# Patient Record
Sex: Male | Born: 1985 | Race: White | Hispanic: No | Marital: Single | Smoking: Current every day smoker
Health system: Southern US, Community
[De-identification: ages and names within clinical notes are randomized; demographics above are authoritative.]

---

## 2013-09-16 ENCOUNTER — Emergency Department (HOSPITAL_COMMUNITY)
Admission: EM | Admit: 2013-09-16 | Discharge: 2013-09-16 | Disposition: A | Discharge status: Home / Self Care | Attending: Emergency Medicine | Admitting: Emergency Medicine

## 2013-09-16 ENCOUNTER — Emergency Department (HOSPITAL_COMMUNITY)

## 2013-09-16 ENCOUNTER — Encounter (HOSPITAL_COMMUNITY): Payer: Self-pay | Admitting: Emergency Medicine

## 2013-09-16 DIAGNOSIS — N200 Calculus of kidney: Secondary | ICD-10-CM

## 2013-09-16 DIAGNOSIS — F17200 Nicotine dependence, unspecified, uncomplicated: Secondary | ICD-10-CM | POA: Insufficient documentation

## 2013-09-16 LAB — BASIC METABOLIC PANEL
BUN: 24 mg/dL — ABNORMAL HIGH (ref 6–23)
CHLORIDE: 102 meq/L (ref 96–112)
CO2: 25 meq/L (ref 19–32)
CREATININE: 1.25 mg/dL (ref 0.50–1.35)
Calcium: 9.9 mg/dL (ref 8.4–10.5)
GFR calc non Af Amer: 78 mL/min — ABNORMAL LOW (ref >90)
GLUCOSE: 98 mg/dL (ref 70–99)
POTASSIUM: 4 meq/L (ref 3.7–5.3)
Sodium: 141 mEq/L (ref 137–147)

## 2013-09-16 LAB — CBC WITH DIFFERENTIAL/PLATELET
BASOS PCT: 0 % (ref 0–1)
Basophils Absolute: 0 10*3/uL (ref 0.0–0.1)
EOS PCT: 0 % (ref 0–5)
Eosinophils Absolute: 0 10*3/uL (ref 0.0–0.7)
HEMATOCRIT: 46.6 % (ref 39.0–52.0)
HEMOGLOBIN: 15.7 g/dL (ref 13.0–17.0)
LYMPHS PCT: 13 % (ref 12–46)
Lymphs Abs: 1.8 10*3/uL (ref 0.7–4.0)
MCH: 28.9 pg (ref 26.0–34.0)
MCHC: 33.7 g/dL (ref 30.0–36.0)
MCV: 85.8 fL (ref 78.0–100.0)
MONOS PCT: 9 % (ref 3–12)
Monocytes Absolute: 1.2 10*3/uL — ABNORMAL HIGH (ref 0.1–1.0)
Neutro Abs: 10.7 10*3/uL — ABNORMAL HIGH (ref 1.7–7.7)
Neutrophils Relative %: 78 % — ABNORMAL HIGH (ref 43–77)
Platelets: 189 10*3/uL (ref 150–400)
RBC: 5.43 MIL/uL (ref 4.22–5.81)
RDW: 12.6 % (ref 11.5–15.5)
WBC: 13.7 10*3/uL — AB (ref 4.0–10.5)

## 2013-09-16 LAB — URINE MICROSCOPIC-ADD ON

## 2013-09-16 LAB — URINALYSIS, ROUTINE W REFLEX MICROSCOPIC
GLUCOSE, UA: NEGATIVE mg/dL
Ketones, ur: NEGATIVE mg/dL
Leukocytes, UA: NEGATIVE
Nitrite: NEGATIVE
PH: 6 (ref 5.0–8.0)
Specific Gravity, Urine: >1.03 — ABNORMAL HIGH (ref 1.005–1.030)
Urobilinogen, UA: 0.2 mg/dL (ref 0.0–1.0)

## 2013-09-16 MED ORDER — HYDROMORPHONE HCL PF 1 MG/ML IJ SOLN
1.0000 mg | Freq: Once | INTRAMUSCULAR | Status: AC
  Administered 2013-09-16: 1 mg via INTRAVENOUS
  Filled 2013-09-16: qty 1

## 2013-09-16 MED ORDER — KETOROLAC TROMETHAMINE 30 MG/ML IJ SOLN
30.0000 mg | Freq: Once | INTRAMUSCULAR | Status: AC
  Administered 2013-09-16: 30 mg via INTRAVENOUS
  Filled 2013-09-16: qty 1

## 2013-09-16 MED ORDER — IBUPROFEN 800 MG PO TABS: 800.0000 mg | ORAL_TABLET | Freq: Three times a day (TID) | ORAL | Status: AC

## 2013-09-16 MED ORDER — ONDANSETRON HCL 4 MG PO TABS: 4.0000 mg | ORAL_TABLET | Freq: Four times a day (QID) | ORAL | Status: AC

## 2013-09-16 MED ORDER — OXYCODONE-ACETAMINOPHEN 5-325 MG PO TABS: 2.0000 | ORAL_TABLET | ORAL | Status: AC | PRN

## 2013-09-16 MED ORDER — ONDANSETRON HCL 4 MG/2ML IJ SOLN
4.0000 mg | Freq: Once | INTRAMUSCULAR | Status: AC
  Administered 2013-09-16: 4 mg via INTRAVENOUS
  Filled 2013-09-16: qty 2

## 2013-09-16 MED ORDER — OXYCODONE-ACETAMINOPHEN 5-325 MG PO TABS
2.0000 | ORAL_TABLET | Freq: Once | ORAL | Status: AC
  Administered 2013-09-16: 2 via ORAL
  Filled 2013-09-16: qty 2

## 2013-09-16 MED ORDER — SODIUM CHLORIDE 0.9 % IV BOLUS (SEPSIS)
1000.0000 mL | Freq: Once | INTRAVENOUS | Status: AC
  Administered 2013-09-16: 1000 mL via INTRAVENOUS

## 2013-09-16 NOTE — ED Provider Notes (Signed)
CSN: [id number]     Arrival date & time 09/16/13  5732 History  This chart was scribed for Glynn Octave, MD by Shari Heritage, ED Scribe. The patient was seen in room APA04/APA04. Patient's care was started at 8:47 AM.  Chief Complaint  Patient presents with  . Flank Pain    The history is provided by the patient. No language interpreter was used.    HPI Comments: Jared Olson is a 28 y.o. male who presents to the Emergency Department complaining of severe, constant left flank pain that radiates towards his groin onset yesterday. He denies any relieving factors. Patient reports associated decreased urine, urinary urgency, nausea and left testicle pain. Patient denies a history of similar pain. Patient's last bowel movement was yesterday. He denies vomiting, diarrhea, hematuria, dysuria, chest pain, shortness of breath, penile discharge, testicular sweling or other symptoms at this time. Patient denies history of nephrolithiasis. He has no history of appendectomy or cholecystectomy. He has no known allergies. Patient has no chronic medical conditions. He is smokes cigarettes.  History reviewed. No pertinent past medical history. History reviewed. No pertinent past surgical history. History reviewed. No pertinent family history. History  Substance Use Topics  . Smoking status: Current Every Day Smoker  . Smokeless tobacco: Not on file  . Alcohol Use: Yes    Review of Systems A complete 10 system review of systems was obtained and all systems are negative except as noted in the HPI and PMH.    Allergies  Review of patient's allergies indicates no known allergies.  Home Medications   Prior to Admission medications   Not on File   Triage Vitals: BP 133/81  Pulse 58  Temp(Src) 97.9 F (36.6 C) (Oral)  Resp 17  Ht 6\' 2"  (1.88 m)  Wt 220 lb (99.791 kg)  BMI 28.23 kg/m2  SpO2 100% Physical Exam  Nursing note and vitals reviewed. Constitutional: He is oriented to person, place,  and time. He appears well-developed and well-nourished. No distress.  Uncomfortable.  HENT:  Head: Normocephalic and atraumatic.  Eyes: EOM are normal.  Neck: Neck supple. No tracheal deviation present.  Cardiovascular: Normal rate, regular rhythm and normal heart sounds.  Exam reveals no gallop and no friction rub.   No murmur heard. Pulmonary/Chest: Effort normal and breath sounds normal. No respiratory distress. He has no wheezes. He has no rales.  Abdominal: Soft. There is tenderness. There is CVA tenderness. There is no rebound and no guarding.  Left lower quadrant tenderness. Left CVA tenderness.  Genitourinary: Right testis shows no swelling and no tenderness. Left testis shows tenderness. Left testis shows no swelling.  No testicular lie. Left testicular tenderness.  Musculoskeletal: Normal range of motion.  Neurological: He is alert and oriented to person, place, and time.  Skin: Skin is warm and dry.  Psychiatric: He has a normal mood and affect. His behavior is normal.    ED Course  Procedures (including critical care time) DIAGNOSTIC STUDIES: Oxygen Saturation is 100% on room air, normal by my interpretation.    COORDINATION OF CARE: 8:53 AM- Will give IV fluids, Zofran and Dilaudid. Will order CT of abdomen, [place] of abd/pelvis and [place] of scrotum, in addition to CBC with diff, BMP, and UA. Patient informed of current plan for treatment and evaluation and agrees with plan at this time.  10:26 AM - Reports mild improvement after pain medicines. Updated patient on labs and imaging studies which show left 1-2 mm kidney stone. Normal scrotum and testicles.  Advised patient that given size of kidney stone he is likely to pass this with nausea and pain medicines. Will discharge with prescriptions and care instructions.   Labs Review Labs Reviewed  URINALYSIS, ROUTINE W REFLEX MICROSCOPIC - Abnormal; Notable for the following:    Specific Gravity, Urine >1.030 (*)    Hgb urine  dipstick LARGE (*)    Bilirubin Urine SMALL (*)    Protein, ur TRACE (*)    All other components within normal limits  CBC WITH DIFFERENTIAL - Abnormal; Notable for the following:    WBC 13.7 (*)    Neutrophils Relative % 78 (*)    Neutro Abs 10.7 (*)    Monocytes Absolute 1.2 (*)    All other components within normal limits  BASIC METABOLIC PANEL - Abnormal; Notable for the following:    BUN 24 (*)    GFR calc non Af Amer 78 (*)    All other components within normal limits  URINE MICROSCOPIC-ADD ON - Abnormal; Notable for the following:    Bacteria, UA FEW (*)    All other components within normal limits  URINE CULTURE    Imaging Review Ct Abdomen Pelvis Wo Contrast  09/16/2013   CLINICAL DATA:  Left-sided abdominal pain extending to the groin.  EXAM: CT ABDOMEN AND PELVIS WITHOUT CONTRAST  TECHNIQUE: Multidetector CT imaging of the abdomen and pelvis was performed following the standard protocol without IV contrast.  COMPARISON:  None.  FINDINGS: There is a 2 mm stone obstructing the distal left ureter just proximal to the bladder. There is slight left hydronephrosis. There are no renal calculi.  There are numerous calcified small stones in the gallbladder. Biliary tree is not dilated.  Liver, spleen, pancreas, and adrenal glands are normal. The bowel is normal including the terminal ileum and appendix. No free air or free fluid. No osseous abnormality.  IMPRESSION: 1. 2 mm stone obstructing the distal left ureter just above the bladder. 2. Numerous small stones in the gallbladder.   Electronically Signed   By: Geanie CooleyJim  Maxwell M.D.   On: 09/16/2013 09:40   [place] Scrotum  09/16/2013   CLINICAL DATA:  Left testicle pain today.  EXAM: SCROTAL ULTRASOUND  DOPPLER ULTRASOUND OF THE TESTICLES  TECHNIQUE: Complete ultrasound examination of the testicles, epididymis, and other scrotal structures was performed. Color and spectral Doppler ultrasound were also utilized to evaluate blood flow to the  testicles.  COMPARISON:  None.  FINDINGS: Right testicle  Measurements: 4.2 x 2.8 x 2.8 cm. No mass or microlithiasis visualized. Normal perfusion to the right testicle.  Left testicle  Measurements: 4.4 x 2.5 x 3.0 cm. No mass or microlithiasis visualized. Normal perfusion to the left testicle.  Right epididymis:  Normal in size and appearance.  Left epididymis:  Normal in size and appearance.  Hydrocele:  Small bilateral hydroceles.  Varicocele:  None visualized.  Pulsed Doppler interrogation of both testes demonstrates low resistance arterial and venous waveforms bilaterally.  IMPRESSION: Small bilateral hydroceles. Otherwise, normal exam. Specifically, normal-appearing testicles with normal perfusion.   Electronically Signed   By: Geanie CooleyJim  Maxwell M.D.   On: 09/16/2013 10:05   [place] Art/ven Flow Abd Pelv Doppler Limited  09/16/2013   CLINICAL DATA:  Left testicle pain today.  EXAM: SCROTAL ULTRASOUND  DOPPLER ULTRASOUND OF THE TESTICLES  TECHNIQUE: Complete ultrasound examination of the testicles, epididymis, and other scrotal structures was performed. Color and spectral Doppler ultrasound were also utilized to evaluate blood flow to the testicles.  COMPARISON:  None.  FINDINGS: Right testicle  Measurements: 4.2 x 2.8 x 2.8 cm. No mass or microlithiasis visualized. Normal perfusion to the right testicle.  Left testicle  Measurements: 4.4 x 2.5 x 3.0 cm. No mass or microlithiasis visualized. Normal perfusion to the left testicle.  Right epididymis:  Normal in size and appearance.  Left epididymis:  Normal in size and appearance.  Hydrocele:  Small bilateral hydroceles.  Varicocele:  None visualized.  Pulsed Doppler interrogation of both testes demonstrates low resistance arterial and venous waveforms bilaterally.  IMPRESSION: Small bilateral hydroceles. Otherwise, normal exam. Specifically, normal-appearing testicles with normal perfusion.   Electronically Signed   By: Geanie Cooley M.D.   On: 09/16/2013 10:05      EKG Interpretation None      MDM   Final diagnoses:  Kidney stone   Patient with left flank pain radiating to the left testicle since yesterday. Denies trauma. Denies hematuria. No fever or vomiting.  Urinalysis shows hematuria without infection. Left testicle is tender on exam. Ultrasound shows no evidence of torsion. 2 mm distal left ureteral stone. UA shows no evidence of infection. Pain controlled in ED. No vomiting. Patient will be treated with pain and nausea medications. Followup with urology and PCP. Return precautions discussed.   BP 130/81  Pulse 65  Temp(Src) 97.9 F (36.6 C) (Oral)  Resp 17  Ht 6\' 2"  (1.88 m)  Wt 220 lb (99.791 kg)  BMI 28.23 kg/m2  SpO2 100%    I personally performed the services described in this documentation, which was scribed in my presence. The recorded information has been reviewed and is accurate.    Glynn Octave, MD 09/16/13 [phone]

## 2013-09-16 NOTE — ED Notes (Signed)
Pt states, urine slow since yesterday, flank pain bilateral radiating toward groin.

## 2013-09-16 NOTE — Discharge Instructions (Signed)
Kidney Stones  Kidney stones (urolithiasis) are deposits that form inside your kidneys. The intense pain is caused by the stone moving through the urinary tract. When the stone moves, the ureter goes into spasm around the stone. The stone is usually passed in the urine.   CAUSES   · A disorder that makes certain neck glands produce too much parathyroid hormone (primary hyperparathyroidism).  · A buildup of uric acid crystals, similar to gout in your joints.  · Narrowing (stricture) of the ureter.  · A kidney obstruction present at birth (congenital obstruction).  · Previous surgery on the kidney or ureters.  · Numerous kidney infections.  SYMPTOMS   · Feeling sick to your stomach (nauseous).  · Throwing up (vomiting).  · Blood in the urine (hematuria).  · Pain that usually spreads (radiates) to the groin.  · Frequency or urgency of urination.  DIAGNOSIS   · Taking a history and physical exam.  · Blood or urine tests.  · CT scan.  · Occasionally, an examination of the inside of the urinary bladder (cystoscopy) is performed.  TREATMENT   · Observation.  · Increasing your fluid intake.  · Extracorporeal shock wave lithotripsy This is a noninvasive procedure that uses shock waves to break up kidney stones.  · Surgery may be needed if you have severe pain or persistent obstruction. There are various surgical procedures. Most of the procedures are performed with the use of small instruments. Only small incisions are needed to accommodate these instruments, so recovery time is minimized.  The size, location, and chemical composition are all important variables that will determine the proper choice of action for you. Talk to your health care provider to better understand your situation so that you will minimize the risk of injury to yourself and your kidney.   HOME CARE INSTRUCTIONS   · Drink enough water and fluids to keep your urine clear or pale yellow. This will help you to pass the stone or stone fragments.  · Strain  all urine through the provided strainer. Keep all particulate matter and stones for your health care provider to see. The stone causing the pain may be as small as a grain of salt. It is very important to use the strainer each and every time you pass your urine. The collection of your stone will allow your health care provider to analyze it and verify that a stone has actually passed. The stone analysis will often identify what you can do to reduce the incidence of recurrences.  · Only take over-the-counter or prescription medicines for pain, discomfort, or fever as directed by your health care provider.  · Make a follow-up appointment with your health care provider as directed.  · Get follow-up X-rays if required. The absence of pain does not always mean that the stone has passed. It may have only stopped moving. If the urine remains completely obstructed, it can cause loss of kidney function or even complete destruction of the kidney. It is your responsibility to make sure X-rays and follow-ups are completed. Ultrasounds of the kidney can show blockages and the status of the kidney. Ultrasounds are not associated with any radiation and can be performed easily in a matter of minutes.  SEEK MEDICAL CARE IF:  · You experience pain that is progressive and unresponsive to any pain medicine you have been prescribed.  SEEK IMMEDIATE MEDICAL CARE IF:   · Pain cannot be controlled with the prescribed medicine.  · You have a fever   or shaking chills.  · The severity or intensity of pain increases over 18 hours and is not relieved by pain medicine.  · You develop a new onset of abdominal pain.  · You feel faint or pass out.  · You are unable to urinate.  MAKE SURE YOU:   · Understand these instructions.  · Will watch your condition.  · Will get help right away if you are not doing well or get worse.  Document Released: 04/11/2005 Document Revised: 12/12/2012 Document Reviewed: 09/12/2012  [employer]® Patient Information ©2014  [employer]

## 2013-09-17 LAB — URINE CULTURE

## 2015-09-24 IMAGING — CT CT ABD-PELV W/O CM
2 of 3 series · 9 of 46 positions shown, 11 images · non-contrast
Comparison: None.

CLINICAL DATA: Left-sided abdominal pain extending to the groin.

EXAM:
CT ABDOMEN AND PELVIS WITHOUT CONTRAST
TECHNIQUE: Multidetector CT imaging of the abdomen and pelvis was performed
following the standard protocol without IV contrast.

[Series 4: mpr coronal (id) · coronal · 0.77mm/px · 8 of 97 slices shown, 9 images]
[im 11/97  soft-tissue]
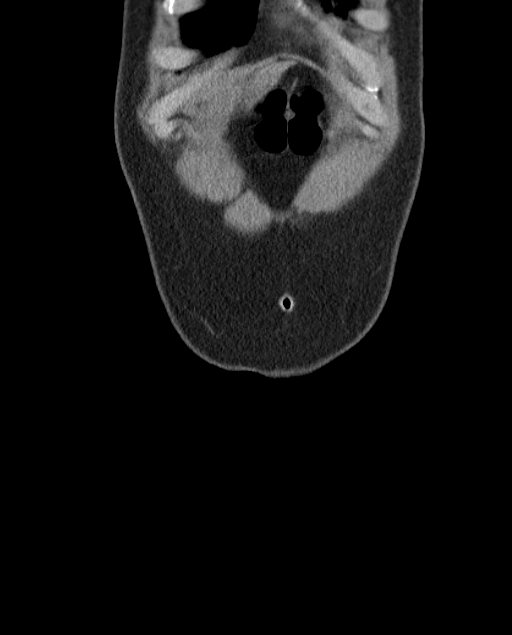
[im 11/97  bone]
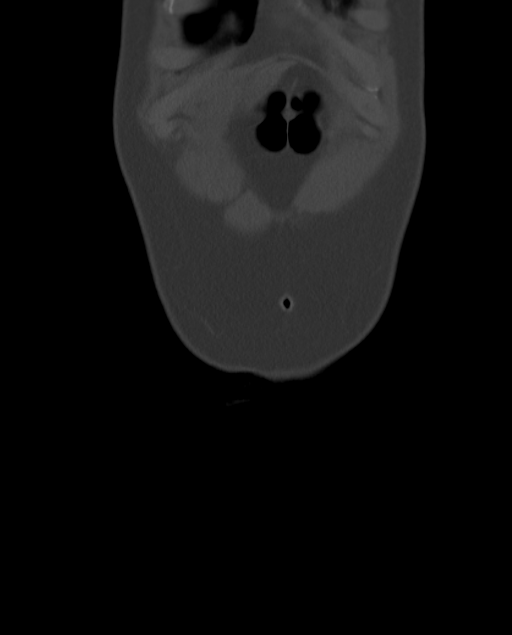
[im 22/97  soft-tissue]
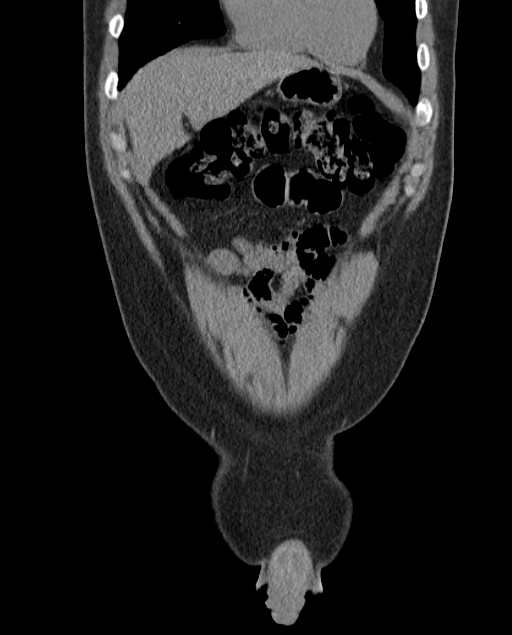
[im 33/97  soft-tissue]
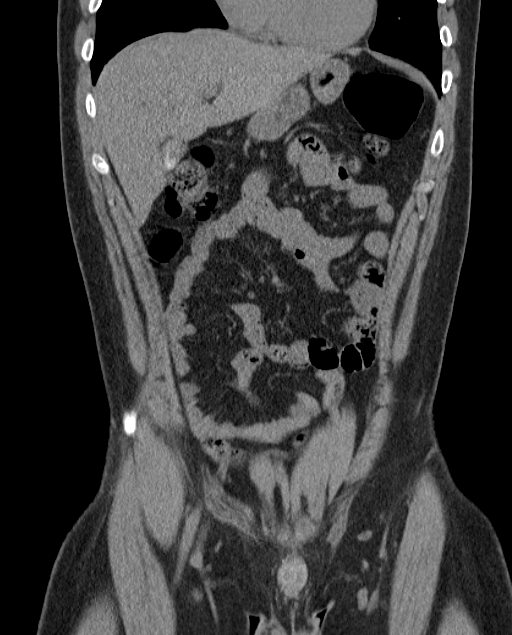
[im 43/97  soft-tissue]
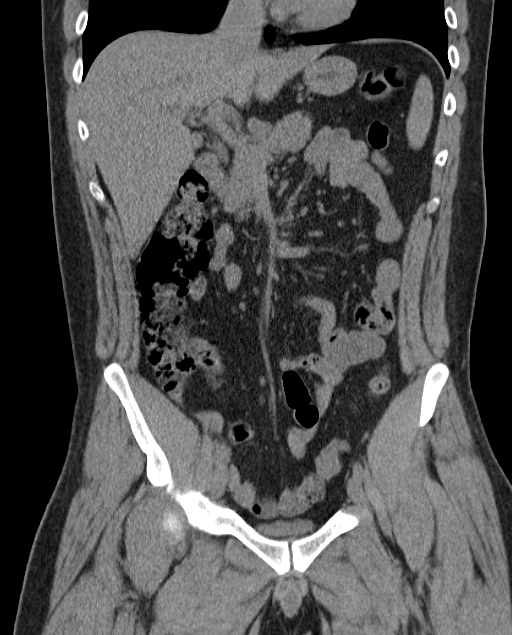
[im 54/97  soft-tissue]
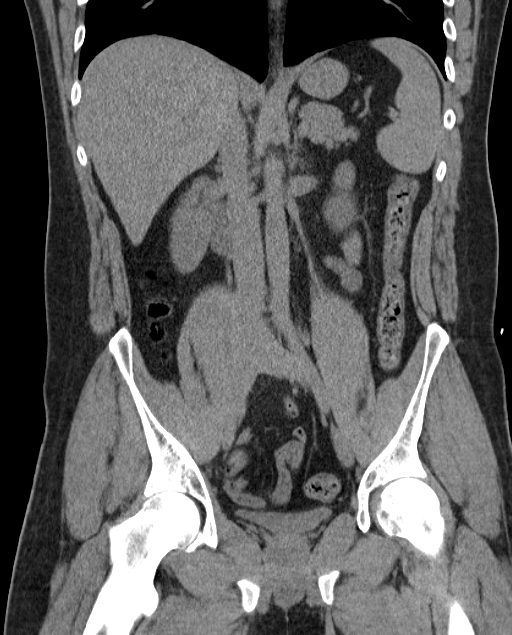
[im 65/97  soft-tissue]
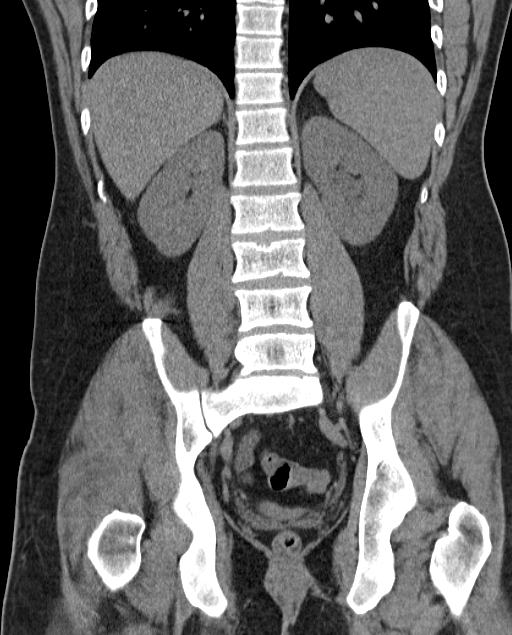
[im 75/97  soft-tissue]
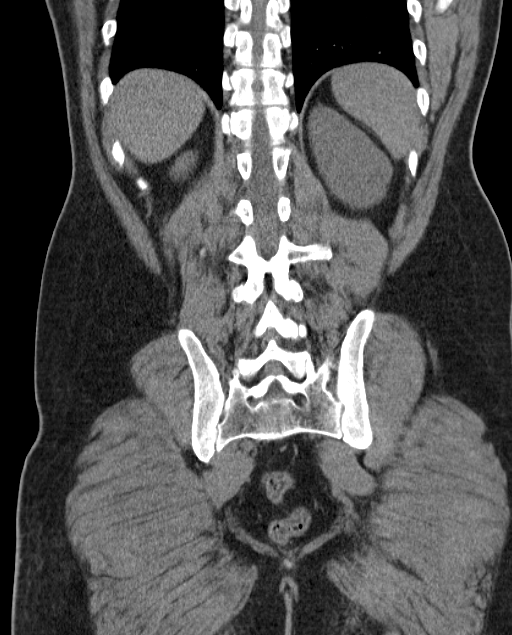
[im 86/97  soft-tissue]
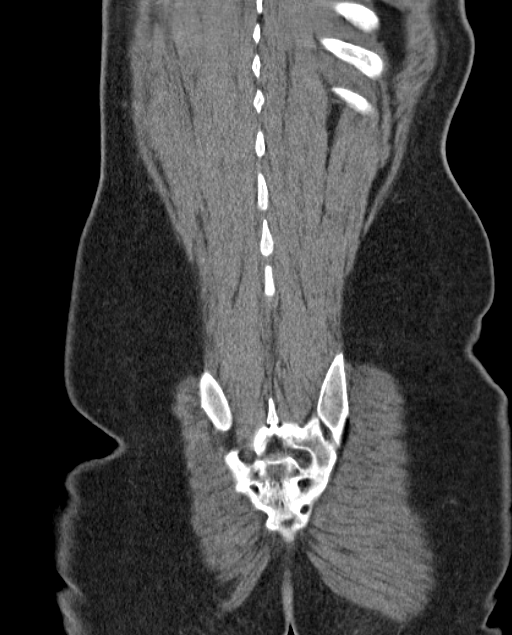

[Series 5: mpr sagittal (id) · sagittal · 0.63mm/px · 1 of 119 slices shown, 2 images]
[im 40/119  soft-tissue]
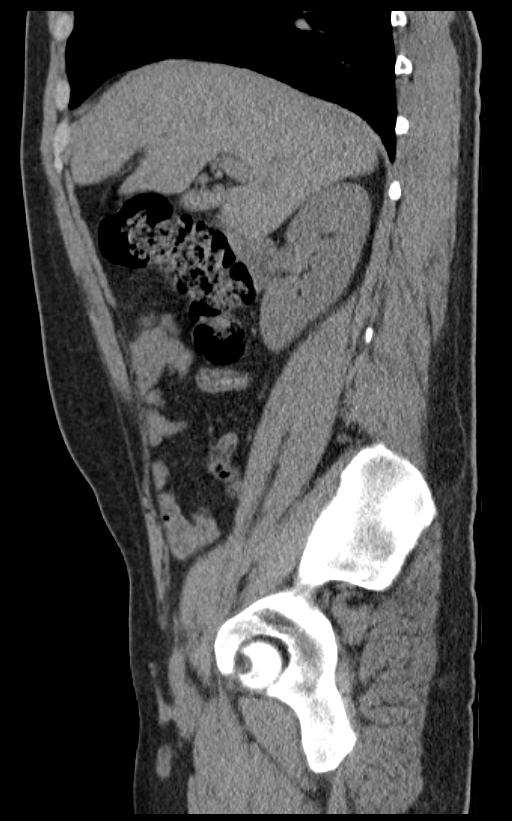
[im 40/119  bone]
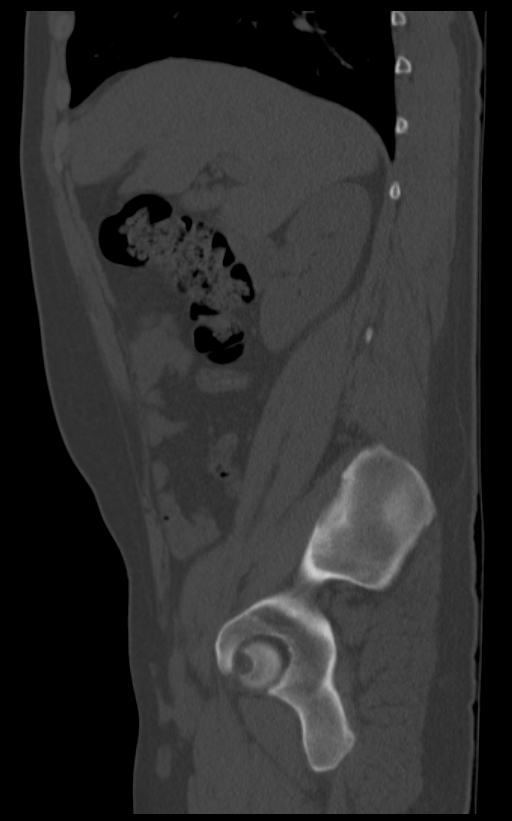

[9 of 46 positions shown; findings below may reference images not displayed]

FINDINGS: There is a 2 mm stone obstructing the distal left ureter just
proximal to the bladder. There is slight left hydronephrosis. There
are no renal calculi.

There are numerous calcified small stones in the gallbladder.
Biliary tree is not dilated.

Liver, spleen, pancreas, and adrenal glands are normal. The bowel is
normal including the terminal ileum and appendix. No free air or
free fluid. No osseous abnormality.
IMPRESSION: 1. 2 mm stone obstructing the distal left ureter just above the
bladder.
2. Numerous small stones in the gallbladder.
